# Patient Record
Sex: Female | Born: 2000 | Race: White | Hispanic: No | Marital: Single | Smoking: Never smoker
Health system: Southern US, Community
[De-identification: ages and names within clinical notes are randomized; demographics above are authoritative.]

## PROBLEM LIST (undated history)

## (undated) DIAGNOSIS — F909 Attention-deficit hyperactivity disorder, unspecified type: Secondary | ICD-10-CM

## (undated) DIAGNOSIS — J45909 Unspecified asthma, uncomplicated: Secondary | ICD-10-CM

## (undated) DIAGNOSIS — F419 Anxiety disorder, unspecified: Secondary | ICD-10-CM

## (undated) DIAGNOSIS — F509 Eating disorder, unspecified: Secondary | ICD-10-CM

## (undated) HISTORY — PX: TONSILLECTOMY: SUR1361

---
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
## 2022-01-11 ENCOUNTER — Telehealth: Payer: Medicaid Other | Admitting: Nurse Practitioner

## 2022-01-11 DIAGNOSIS — J40 Bronchitis, not specified as acute or chronic: Secondary | ICD-10-CM | POA: Diagnosis not present

## 2022-01-11 MED ORDER — AZITHROMYCIN 250 MG PO TABS: ORAL_TABLET | ORAL | 0 refills | Status: AC

## 2022-01-11 MED ORDER — BENZONATATE 100 MG PO CAPS: 100.0000 mg | ORAL_CAPSULE | Freq: Three times a day (TID) | ORAL | 0 refills | Status: AC | PRN

## 2022-01-11 NOTE — Progress Notes (Signed)
Virtual Visit Consent   Kristin Parsons, you are scheduled for a virtual visit with a  provider today. Just as with appointments in the office, your consent must be obtained to participate. Your consent will be active for this visit and any virtual visit you may have with one of our providers in the next 365 days. If you have a MyChart account, a copy of this consent can be sent to you electronically.  As this is a virtual visit, video technology does not allow for your provider to perform a traditional examination. This may limit your provider's ability to fully assess your condition. If your provider identifies any concerns that need to be evaluated in person or the need to arrange testing (such as labs, EKG, etc.), we will make arrangements to do so. Although advances in technology are sophisticated, we cannot ensure that it will always work on either your end or our end. If the connection with a video visit is poor, the visit may have to be switched to a telephone visit. With either a video or telephone visit, we are not always able to ensure that we have a secure connection.  By engaging in this virtual visit, you consent to the provision of healthcare and authorize for your insurance to be billed (if applicable) for the services provided during this visit. Depending on your insurance coverage, you may receive a charge related to this service.  I need to obtain your verbal consent now. Are you willing to proceed with your visit today? Kristin Parsons has provided verbal consent on 01/11/2022 for a virtual visit (video or telephone). Viviano Simas, FNP  Date: 01/11/2022 12:32 PM  Virtual Visit via Video Note   I, Viviano Simas, connected with  Kristin Parsons  ([id number], 17-Mar-2000) on 01/11/22 at 12:30 PM EST by a video-enabled telemedicine application and verified that I am speaking with the correct person using two identifiers.  Location: Patient: Virtual Visit Location Patient:  Home Provider: Virtual Visit Location Provider: Home Office   I discussed the limitations of evaluation and management by telemedicine and the availability of in person appointments. The patient expressed understanding and agreed to proceed.    History of Present Illness: Kristin Parsons is a 21 y.o. who identifies as a female who was assigned female at birth, and is being seen today for am ongoing cough.  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   Mild episode of recurrent major depressive disorder ([facility]) 11/11/2019   Major depressive disorder, single episode, severe ([facility])    MDD (major depressive disorder), recurrent severe, 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[facility]) 02/14/2019   Suicidal ideation 02/14/2019   Self-injurious behavior 02/14/2019   Eating disorder 01/02/2019   Generalized anxiety disorder 01/02/2019   Body dysmorphic disorder 12/17/2018   General counseling and advice on female contraception 12/17/2018    Allergies: No Known Allergies Medications:  Current Outpatient Medications:    albuterol (VENTOLIN HFA) 108 (90 Base)  MCG/ACT inhaler, Inhale 2 puffs into the lungs every 6 (six) hours as needed for wheezing or shortness of breath., Disp: 8 g, Rfl: 2   amoxicillin-clavulanate (AUGMENTIN) 875-125 MG tablet, Take 1 tablet by mouth every 12 (twelve) hours., Disp: 14 tablet, Rfl: 0   etonogestrel-ethinyl estradiol (NUVARING) 0.12-0.015 MG/24HR vaginal ring, Place 1 each vaginally every 30  (thirty) days., Disp: , Rfl:    hydrOXYzine (ATARAX/VISTARIL) 10 MG tablet, Take 1 tablet (10 mg total) by mouth 3 (three) times daily as needed., Disp: 90 tablet, Rfl: 3   ibuprofen (ADVIL) 400 MG tablet, Take 1 tablet (400 mg total) by mouth every 6 (six) hours as needed for mild pain or moderate pain., Disp: 30 tablet, Rfl: 0   methylphenidate (CONCERTA) 54 MG PO CR tablet, Take 1 tablet (54 mg total) by mouth daily., Disp: 30 tablet, Rfl: 0   sertraline (ZOLOFT) 25 MG tablet, Take 1 tablet (25 mg total) by mouth daily., Disp: 30 tablet, Rfl: 3  Observations/Objective: Patient is well-developed, well-nourished in no acute distress.  Resting comfortably  at home.  Head is normocephalic, atraumatic.  No labored breathing.  Speech is clear and coherent with logical content.  Patient is alert and oriented at baseline.    Assessment and Plan: 1. Bronchitis Continue to use inhaler every 4-6 hours  - azithromycin (ZITHROMAX) 250 MG tablet; Take 2 tablets on day 1, then 1 tablet daily on days 2 through 5  Dispense: 6 tablet; Refill: 0 - benzonatate (TESSALON) 100 MG capsule; Take 1 capsule (100 mg total) by mouth 3 (three) times daily as needed.  Dispense: 30 capsule; Refill: 0     Follow Up Instructions: I discussed the assessment and treatment plan with the patient. The patient was provided an opportunity to ask questions and all were answered. The patient agreed with the plan and demonstrated an understanding of the instructions.  A copy of instructions were sent to the patient via MyChart unless otherwise noted below.    The patient was advised to call back or seek an in-person evaluation if the symptoms worsen or if the condition fails to improve as anticipated.  Time:  I spent 10 minutes with the patient via telehealth technology discussing the above problems/concerns.    Viviano Simas, FNP

## 2022-07-15 IMAGING — CR DG CHEST 2V
1 series · 2 of 2 positions shown · non-contrast
Comparison: None.

CLINICAL DATA: Shortness of breath left rib pain

EXAM:
CHEST - 2 VIEW

[Series 1: dg chest 2 view · 0.14mm/px · 2 of 2 slices shown]
[im 1/2]
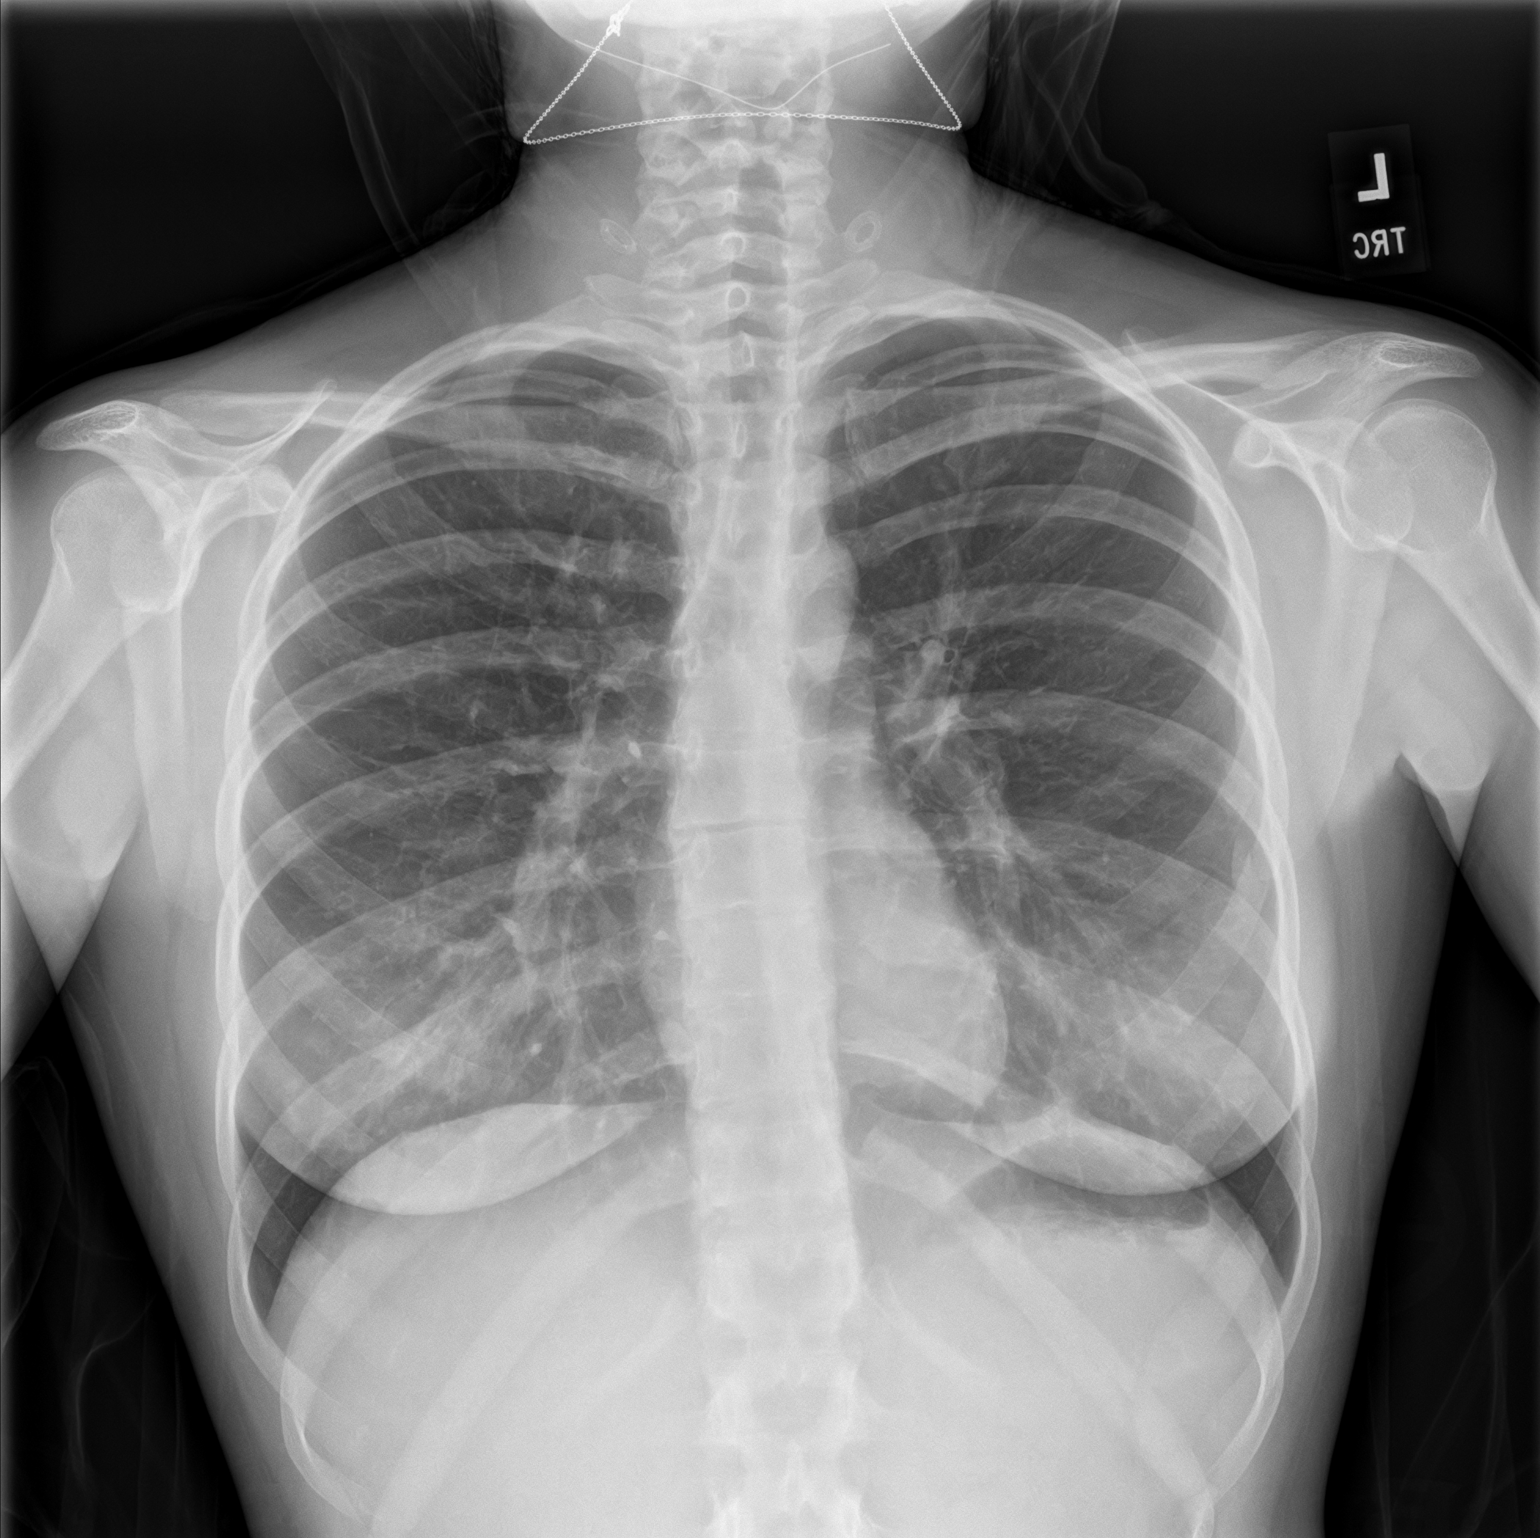
[im 2/2]
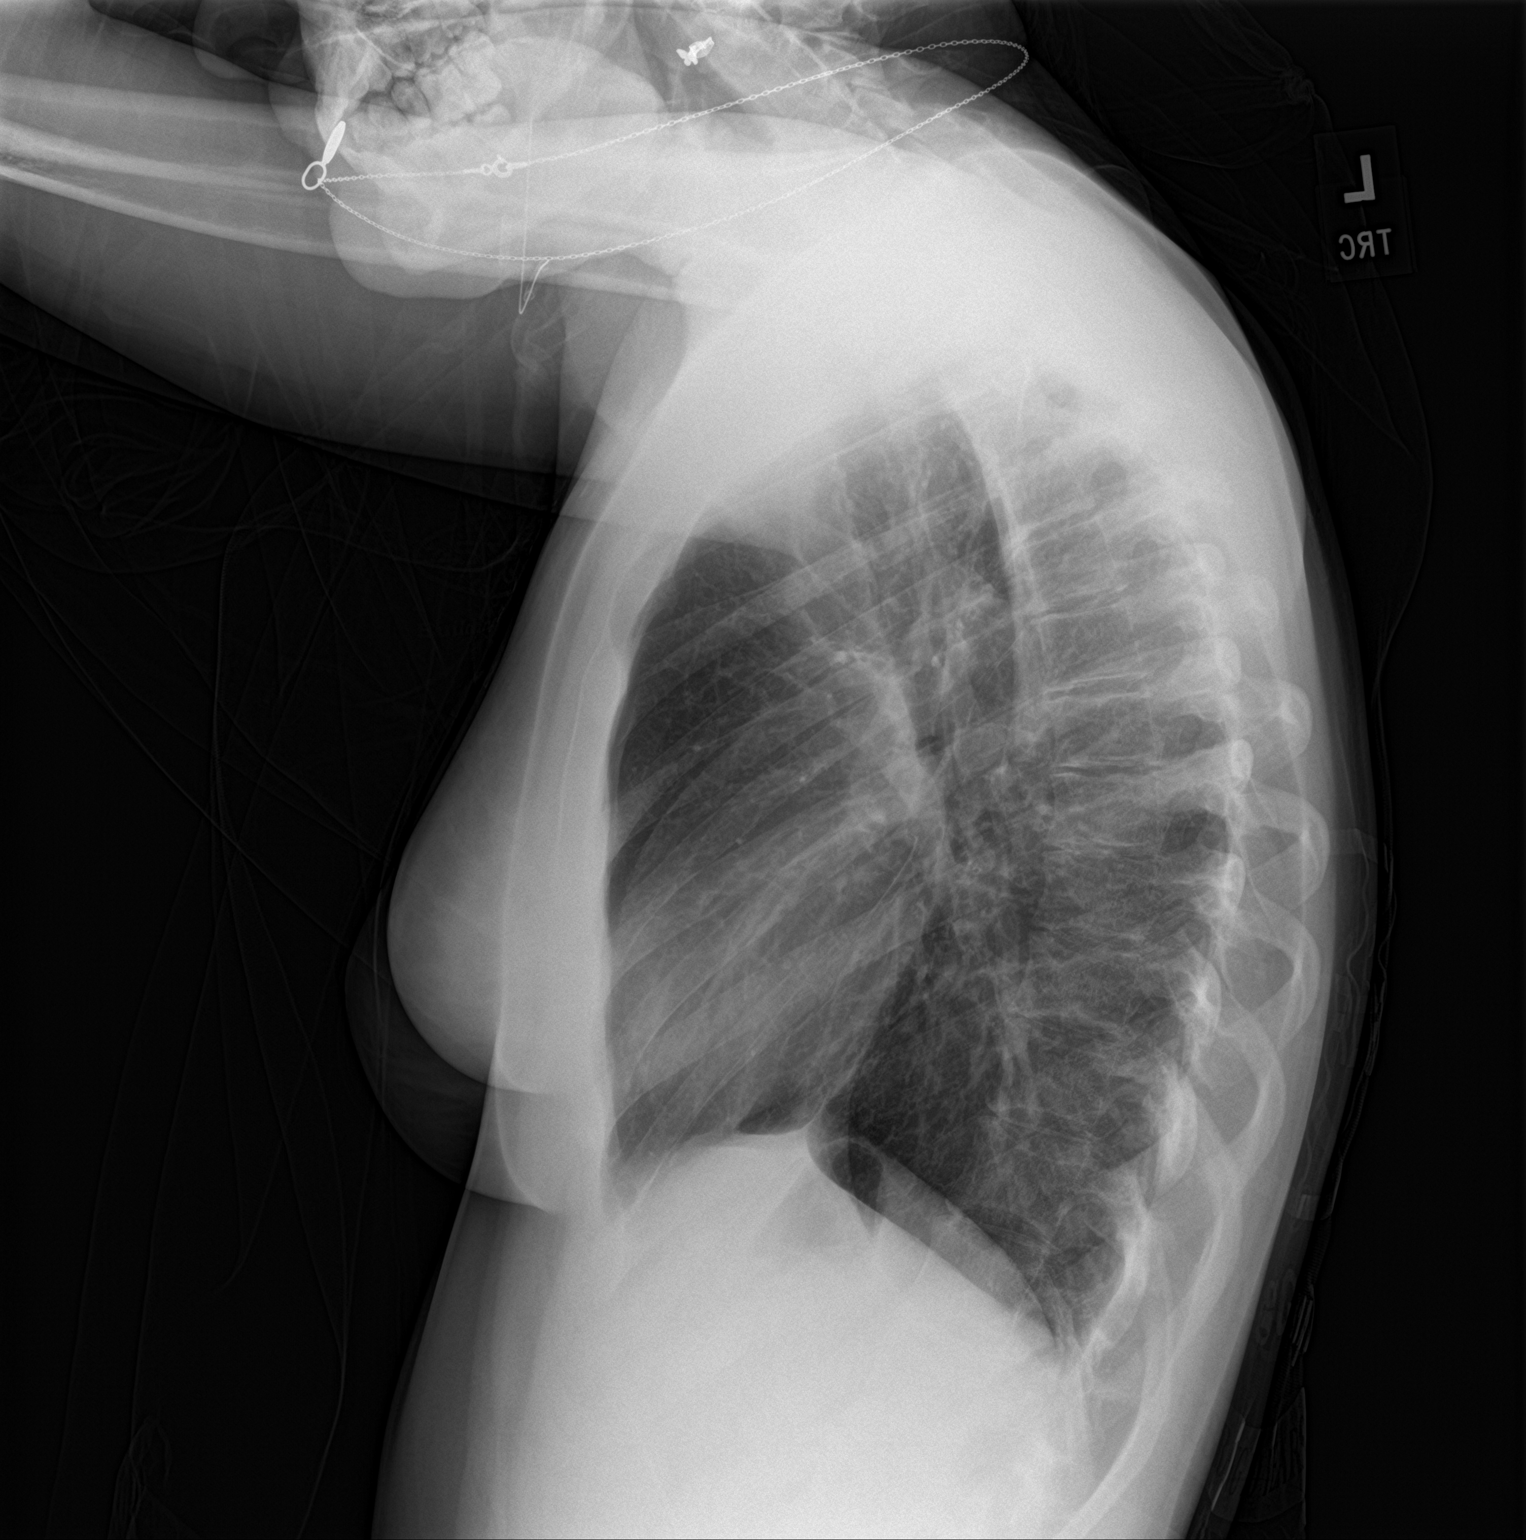

[2 of 2 positions shown; findings below may reference images not displayed]

FINDINGS: No focal opacity or pleural effusion. No definitive pneumothorax.
Suspected left sixth lateral rib fracture.
IMPRESSION: 1. No definitive pneumothorax.
2. Suspected left sixth rib fracture

## 2022-07-18 ENCOUNTER — Inpatient Hospital Stay: Admission: RE | Admit: 2022-07-18 | Payer: Self-pay | Source: Ambulatory Visit

## 2022-07-19 ENCOUNTER — Inpatient Hospital Stay: Admission: RE | Admit: 2022-07-19 | Payer: Self-pay | Source: Ambulatory Visit

## 2022-07-21 ENCOUNTER — Ambulatory Visit: Payer: Self-pay
